# Patient Record
Sex: Female | Born: 1995 | Race: Black or African American | Hispanic: No | Marital: Single | Smoking: Never smoker
Health system: Southern US, Community
[De-identification: ages and names within clinical notes are randomized; demographics above are authoritative.]

## PROBLEM LIST (undated history)

## (undated) DIAGNOSIS — T7840XA Allergy, unspecified, initial encounter: Secondary | ICD-10-CM

## (undated) DIAGNOSIS — J45909 Unspecified asthma, uncomplicated: Secondary | ICD-10-CM

## (undated) DIAGNOSIS — I10 Essential (primary) hypertension: Secondary | ICD-10-CM

## (undated) HISTORY — DX: Allergy, unspecified, initial encounter: T78.40XA

---
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
## 2014-01-18 ENCOUNTER — Encounter (HOSPITAL_COMMUNITY): Payer: Self-pay | Admitting: Emergency Medicine

## 2014-01-18 ENCOUNTER — Emergency Department (HOSPITAL_COMMUNITY)
Admission: EM | Admit: 2014-01-18 | Discharge: 2014-01-18 | Disposition: A | Discharge status: Home / Self Care | Payer: BC Managed Care – PPO | Source: Home / Self Care | Attending: Family Medicine | Admitting: Family Medicine

## 2014-01-18 DIAGNOSIS — A084 Viral intestinal infection, unspecified: Secondary | ICD-10-CM

## 2014-01-18 DIAGNOSIS — A088 Other specified intestinal infections: Secondary | ICD-10-CM

## 2014-01-18 MED ORDER — ONDANSETRON HCL 8 MG PO TABS: 8.0000 mg | ORAL_TABLET | Freq: Three times a day (TID) | ORAL | Status: DC | PRN

## 2014-01-18 NOTE — ED Provider Notes (Signed)
Natalie Thompson is a 18 y.o. female who presents to Urgent Care today for nausea and diarrhea present for 3 days. Patient remained is also sick with a vomiting illness. No fevers or chills or abdominal pain. No blood in stool. No medications tried yet. No cough congestion or trouble breathing.   Past Medical History  Diagnosis Date  . Asthma    History  Substance Use Topics  . Smoking status: Never Smoker   . Smokeless tobacco: Not on file  . Alcohol Use: No   ROS as above Medications: No current facility-administered medications for this encounter.   Current Outpatient Prescriptions  Medication Sig Dispense Refill  . albuterol (PROVENTIL HFA;VENTOLIN HFA) 108 (90 BASE) MCG/ACT inhaler Inhale 1-2 puffs into the lungs every 6 (six) hours as needed for wheezing.  1 Inhaler  0  . amoxicillin (AMOXIL) 500 MG capsule Take 2 capsules (1,000 mg total) by mouth 3 (three) times daily.  60 capsule  0  . fexofenadine-pseudoephedrine (ALLEGRA-D) 60-120 MG per tablet Take 1 tablet by mouth every 12 (twelve) hours.  30 tablet  0  . guaiFENesin-codeine (GUIATUSS AC) 100-10 MG/5ML syrup Take 10 mLs by mouth 4 (four) times daily as needed for cough.  120 mL  0  . naproxen (NAPROSYN) 500 MG tablet Take 1 tablet (500 mg total) by mouth 2 (two) times daily.  30 tablet  0  . ondansetron (ZOFRAN) 8 MG tablet Take 1 tablet (8 mg total) by mouth every 8 (eight) hours as needed for nausea or vomiting.  20 tablet  0    Exam:  BP 117/73  Pulse 109  Temp(Src) 98.1 F (36.7 C) (Oral)  SpO2 100%  LMP 12/30/2013 Gen: Well NAD HEENT: EOMI,  MMM Lungs: Normal work of breathing. CTABL Heart: RRR no MRG Abd: NABS, Soft. NT, ND no rebound or guarding  Exts: Brisk capillary refill, warm and well perfused.   No results found for this or any previous visit (from the past 24 hour(s)). No results found.  Assessment and Plan: 18 y.o. female with viral gastroenteritis. Plan to treat with Zofran Imodium.  Watchful waiting. Followup as needed.  Discussed warning signs or symptoms. Please see discharge instructions. Patient expresses understanding.    Gregor Hams, MD 01/18/14 [phone]

## 2014-01-18 NOTE — ED Notes (Signed)
Patient complains of stomach pain Feels nausea and having dry heaves

## 2014-01-18 NOTE — Discharge Instructions (Signed)
Thank you for coming in today. Take Zofran every 8 hours as needed for vomiting. Use over-the-counter Imodium as needed for diarrhea Do not use Imodium if you're having abdominal pain. If your belly pain worsens, or you have high fever, bad vomiting, blood in your stool or black tarry stool go to the Emergency Room.  Viral Gastroenteritis Viral gastroenteritis is also known as stomach flu. This condition affects the stomach and intestinal tract. It can cause sudden diarrhea and vomiting. The illness typically lasts 3 to 8 days. Most people develop an immune response that eventually gets rid of the virus. While this natural response develops, the virus can make you quite ill. CAUSES  Many different viruses can cause gastroenteritis, such as rotavirus or noroviruses. You can catch one of these viruses by consuming contaminated food or water. You may also catch a virus by sharing utensils or other personal items with an infected person or by touching a contaminated surface. SYMPTOMS  The most common symptoms are diarrhea and vomiting. These problems can cause a severe loss of body fluids (dehydration) and a body salt (electrolyte) imbalance. Other symptoms may include:  Fever.  Headache.  Fatigue.  Abdominal pain. DIAGNOSIS  Your caregiver can usually diagnose viral gastroenteritis based on your symptoms and a physical exam. A stool sample may also be taken to test for the presence of viruses or other infections. TREATMENT  This illness typically goes away on its own. Treatments are aimed at rehydration. The most serious cases of viral gastroenteritis involve vomiting so severely that you are not able to keep fluids down. In these cases, fluids must be given through an intravenous line (IV). HOME CARE INSTRUCTIONS   Drink enough fluids to keep your urine clear or pale yellow. Drink small amounts of fluids frequently and increase the amounts as tolerated.  Ask your caregiver for specific  rehydration instructions.  Avoid:  Foods high in sugar.  Alcohol.  Carbonated drinks.  Tobacco.  Juice.  Caffeine drinks.  Extremely hot or cold fluids.  Fatty, greasy foods.  Too much intake of anything at one time.  Dairy products until 24 to 48 hours after diarrhea stops.  You may consume probiotics. Probiotics are active cultures of beneficial bacteria. They may lessen the amount and number of diarrheal stools in adults. Probiotics can be found in yogurt with active cultures and in supplements.  Wash your hands well to avoid spreading the virus.  Only take over-the-counter or prescription medicines for pain, discomfort, or fever as directed by your caregiver. Do not give aspirin to children. Antidiarrheal medicines are not recommended.  Ask your caregiver if you should continue to take your regular prescribed and over-the-counter medicines.  Keep all follow-up appointments as directed by your caregiver. SEEK IMMEDIATE MEDICAL CARE IF:   You are unable to keep fluids down.  You do not urinate at least once every 6 to 8 hours.  You develop shortness of breath.  You notice blood in your stool or vomit. This may look like coffee grounds.  You have abdominal pain that increases or is concentrated in one small area (localized).  You have persistent vomiting or diarrhea.  You have a fever.  The patient is a child younger than 3 months, and he or she has a fever.  The patient is a child older than 3 months, and he or she has a fever and persistent symptoms.  The patient is a child older than 3 months, and he or she has a fever  and symptoms suddenly get worse.  The patient is a baby, and he or she has no tears when crying. MAKE SURE YOU:   Understand these instructions.  Will watch your condition.  Will get help right away if you are not doing well or get worse. Document Released: 11/10/2005 Document Revised: 02/02/2012 Document Reviewed:  08/27/2011 [facility] Patient Information 2014 [place].

## 2014-01-19 LAB — POCT PREGNANCY, URINE: Preg Test, Ur: NEGATIVE

## 2015-09-25 ENCOUNTER — Ambulatory Visit (INDEPENDENT_AMBULATORY_CARE_PROVIDER_SITE_OTHER): Payer: BLUE CROSS/BLUE SHIELD | Admitting: Family Medicine

## 2015-09-25 VITALS — BP 124/80 | HR 75 | Temp 98.4°F | Resp 16 | Ht 64.0 in | Wt 167.4 lb

## 2015-09-25 DIAGNOSIS — J0100 Acute maxillary sinusitis, unspecified: Secondary | ICD-10-CM | POA: Diagnosis not present

## 2015-09-25 MED ORDER — AMOXICILLIN-POT CLAVULANATE 875-125 MG PO TABS
1.0000 | ORAL_TABLET | Freq: Two times a day (BID) | ORAL | Status: DC
Start: 2015-09-25 — End: 2016-02-10

## 2015-09-25 MED ORDER — HYDROCOD POLST-CPM POLST ER 10-8 MG/5ML PO SUER: 5.0000 mL | Freq: Two times a day (BID) | ORAL | Status: DC | PRN

## 2015-09-25 MED ORDER — PREDNISONE 20 MG PO TABS: ORAL_TABLET | ORAL | Status: DC

## 2015-09-25 NOTE — Progress Notes (Signed)
Subjective:  This chart was scribed for Delman Cheadle MD, by Tamsen Roers, at [facility].  This patient was seen in room 10 and the patient's care was started at 12:29 PM.    Patient ID: Natalie Thompson, female    DOB: [birth year], 19 y.o.   MRN: [id number] Chief Complaint  Patient presents with  . Cough  . Flu Vaccine    HPI  HPI Comments: Natalie Thompson is a 19 y.o. female who presents to the [facility] complaining of a productive cough onset two weeks ago (lasted 4-5 days) and states that it came back yesterday.  She lost her voice three days ago and states that she had a sore throat when this occurred.    She has associated symptoms of congestion and chest pain at night time due to the congestion. She was taking dayquil and nyquil as well as Theraflu but states that it did not alleviate her symptoms fully. She did not have an appetite this morning. She denies any urinary/ abnormal bowel symptoms.  Her best friend was sick recently.  She has no other complaints or concerns today.  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    Past Medical History  Diagnosis Date  . Asthma     Current Outpatient Prescriptions on File Prior to Visit  Medication Sig Dispense Refill  . albuterol (PROVENTIL HFA;VENTOLIN HFA) 108 (90 BASE) MCG/ACT inhaler Inhale 1-2 puffs into the lungs every 6 (six) hours as needed for wheezing. 1 Inhaler 0  . fexofenadine-pseudoephedrine (ALLEGRA-D) 60-120 MG per tablet Take 1 tablet by mouth every 12 (twelve) hours. (Patient not taking: Reported on 09/25/2015) 30 tablet 0  . guaiFENesin-codeine (GUIATUSS AC) 100-10 MG/5ML syrup Take 10 mLs by mouth 4 (four) times daily as needed for cough. (Patient not taking: Reported on 09/25/2015) 120 mL 0  . naproxen (NAPROSYN) 500 MG tablet Take 1 tablet (500 mg total) by mouth 2 (two) times daily. (Patient not taking: Reported on 09/25/2015)  30 tablet 0   No current facility-administered medications on file prior to visit.    No Known Allergies     Review of Systems  Constitutional: Negative for fever and chills.  HENT: Positive for congestion and sore throat.   Eyes: Negative for pain, redness and itching.  Respiratory: Positive for cough. Negative for choking and shortness of breath.   Cardiovascular: Negative for chest pain.  Gastrointestinal: Negative for nausea and vomiting.  Musculoskeletal: Negative for neck pain and neck stiffness.  Neurological: Negative for syncope and speech difficulty.       Objective:   Physical Exam  Constitutional: She is oriented to person, place, and time. She appears well-developed and well-nourished. No distress.  HENT:  Head: Normocephalic and atraumatic.  Mouth/Throat: Oropharynx is clear and moist.  Mid ear effusion on the right and left with some erythema.  Nares left greater than right with erythema and bogginess.   Eyes: Pupils are equal, round, and reactive to light.  Neck: Normal range of motion. No thyromegaly present.  Some submandibular nodes are enlarged. Tonsillar nodes are enlarged.  No anterior cervical lymphadenopathy Positive posterior cervical nodes, left greater than right.   Cardiovascular: Normal rate, regular rhythm, S1 normal, S2 normal and normal heart sounds.  Exam reveals no gallop and no friction rub.   No murmur heard. Pulmonary/Chest: Effort normal and breath sounds normal. No respiratory distress. She  has no wheezes. She has no rales. She exhibits no tenderness.  Musculoskeletal: Normal range of motion.  Lymphadenopathy:    She has no cervical adenopathy.  Neurological: She is alert and oriented to person, place, and time.  Skin: Skin is warm and dry.  Psychiatric: She has a normal mood and affect. Her behavior is normal.   Filed Vitals:   09/25/15 1204  BP: 124/80  Pulse: 75  Temp: 98.4 F (36.9 C)  TempSrc: Oral  Resp: 16  Height: 5'  4" (1.626 m)  Weight: 167 lb 6.4 oz (75.932 kg)  SpO2: 99%          Assessment & Plan:   1. Acute maxillary sinusitis, recurrence not specified      Meds ordered this encounter  Medications  . predniSONE (DELTASONE) 20 MG tablet    Sig: 3 tabs po qd x 3d, 2 tabs po qd x 3d, 1 tab po qd x 3d    Dispense:  18 tablet    Refill:  0  . amoxicillin-clavulanate (AUGMENTIN) 875-125 MG tablet    Sig: Take 1 tablet by mouth 2 (two) times daily.    Dispense:  20 tablet    Refill:  0  . chlorpheniramine-HYDROcodone (TUSSIONEX PENNKINETIC ER) 10-8 MG/5ML SUER    Sig: Take 5 mLs by mouth every 12 (twelve) hours as needed for cough.    Dispense:  90 mL    Refill:  0    I personally performed the services described in this documentation, which was scribed in my presence. The recorded information has been reviewed and considered, and addended by me as needed.  Delman Cheadle, MD MPH

## 2015-09-25 NOTE — Patient Instructions (Signed)

## 2016-02-10 ENCOUNTER — Ambulatory Visit (INDEPENDENT_AMBULATORY_CARE_PROVIDER_SITE_OTHER): Payer: BLUE CROSS/BLUE SHIELD | Admitting: Physician Assistant

## 2016-02-10 VITALS — BP 118/74 | HR 74 | Temp 98.5°F | Resp 14 | Ht 64.0 in | Wt 173.0 lb

## 2016-02-10 DIAGNOSIS — J302 Other seasonal allergic rhinitis: Secondary | ICD-10-CM | POA: Diagnosis not present

## 2016-02-10 DIAGNOSIS — J069 Acute upper respiratory infection, unspecified: Secondary | ICD-10-CM

## 2016-02-10 DIAGNOSIS — J4520 Mild intermittent asthma, uncomplicated: Secondary | ICD-10-CM

## 2016-02-10 DIAGNOSIS — B9789 Other viral agents as the cause of diseases classified elsewhere: Principal | ICD-10-CM

## 2016-02-10 MED ORDER — GUAIFENESIN ER 1200 MG PO TB12: 1.0000 | ORAL_TABLET | Freq: Two times a day (BID) | ORAL | Status: AC

## 2016-02-10 MED ORDER — ALBUTEROL SULFATE HFA 108 (90 BASE) MCG/ACT IN AERS: 1.0000 | INHALATION_SPRAY | Freq: Four times a day (QID) | RESPIRATORY_TRACT | Status: DC | PRN

## 2016-02-10 MED ORDER — FLUTICASONE PROPIONATE 50 MCG/ACT NA SUSP: 2.0000 | Freq: Every day | NASAL | Status: DC

## 2016-02-10 MED ORDER — HYDROCODONE-HOMATROPINE 5-1.5 MG/5ML PO SYRP: 5.0000 mL | ORAL_SOLUTION | Freq: Three times a day (TID) | ORAL | Status: DC | PRN

## 2016-02-10 NOTE — Patient Instructions (Addendum)
  Please push fluids.  Tylenol and Motrin for fever and body aches.    A humidifier can help especially when the air is dry -if you do not have a humidifier you can boil a pot of water on the stove in your home to help with the dry air.    IF you received an x-ray today, you will receive an invoice from [facility]. Please contact [facility] at [phone] with questions or concerns regarding your invoice.   IF you received labwork today, you will receive an invoice from [employer]. Please contact [facility] at [phone] with questions or concerns regarding your invoice.   Our billing staff will not be able to assist you with questions regarding bills from these companies.  You will be contacted with the lab results as soon as they are available. The fastest way to get your results is to activate your My Chart account. Instructions are located on the last page of this paperwork. If you have not heard from [place] regarding the results in 2 weeks, please contact this office.

## 2016-02-10 NOTE — Progress Notes (Signed)
   Natalie Thompson  MRN: [id number] DOB: [birth year]  Subjective:  Pt presents to clinic with 1 week h/o cold symptoms.  She was at spring break in [place] last week and she felt fine and when she got back to school it was cold and she started feeling bad.  She has seasonal allergies but this feel different and during her allergies is typically when she has problems with her asthma.  She has not felt like she needed her albuterol at all with this illness.  She has had chills but no fever.  She has sinus congestion with yellow rhinorrhea and a cough with yellow sputum at times.    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   No current outpatient prescriptions on file prior to visit.   No current facility-administered medications on file prior to visit.    No Known Allergies  Review of Systems  Constitutional: Positive for chills. Negative for fever.  HENT: Positive for congestion, rhinorrhea (yellow) and sore throat (worse in the am, slightly better throughout the day). Negative for postnasal drip.   Respiratory: Positive for cough (yellow/brown).        H/o seasonal allergies and asthma - no current asthma symptoms, smoker - marji - daily  Gastrointestinal: Positive for nausea. Negative for vomiting, abdominal pain and diarrhea.  Musculoskeletal: Positive for myalgias (mild).  Neurological: Positive for headaches.  Psychiatric/Behavioral: Positive for sleep disturbance (2nd to cough).   Objective:  BP 118/74 mmHg  Pulse 74  Temp(Src) 98.5 F (36.9 C) (Oral)  Resp 14  Ht 5\' 4"  (1.626 m)  Wt 173 lb (78.472 kg)  BMI 29.68 kg/m2  SpO2 98%  LMP 02/10/2016  Physical Exam  Constitutional: She is oriented to person, place, and time and well-developed, well-nourished, and in no distress.  HENT:  Head: Normocephalic and atraumatic.  Right Ear: Hearing, tympanic  membrane, external ear and ear canal normal.  Left Ear: Hearing, tympanic membrane, external ear and ear canal normal.  Nose: Mucosal edema (red) present.  Mouth/Throat: Uvula is midline, oropharynx is clear and moist and mucous membranes are normal.  Eyes: Conjunctivae are normal.  Neck: Normal range of motion.  Cardiovascular: Normal rate, regular rhythm and normal heart sounds.   No murmur heard. Pulmonary/Chest: Effort normal and breath sounds normal. She has no wheezes.  Neurological: She is alert and oriented to person, place, and time. Gait normal.  Skin: Skin is warm and dry.  Psychiatric: Mood, memory, affect and judgment normal.  Vitals reviewed.   Assessment and Plan :  Viral URI with cough - Plan: HYDROcodone-homatropine (HYCODAN) 5-1.5 MG/5ML syrup, Guaifenesin (MUCINEX MAXIMUM STRENGTH) 1200 MG TB12  Seasonal allergies - Plan: fluticasone (FLONASE) 50 MCG/ACT nasal spray  Asthma, mild intermittent, uncomplicated - Plan: albuterol (PROVENTIL HFA;VENTOLIN HFA) 108 (90 Base) MCG/ACT inhaler   Symptomatic treatment d/w pt along with the above plan.  She is ok to be at school.    Windell Hummingbird PA-C  [facility] 02/10/2016 9:36 AM

## 2016-02-11 ENCOUNTER — Encounter (HOSPITAL_COMMUNITY): Payer: Self-pay | Admitting: Emergency Medicine

## 2016-02-11 ENCOUNTER — Emergency Department (HOSPITAL_COMMUNITY)
Admission: EM | Admit: 2016-02-11 | Discharge: 2016-02-11 | Disposition: A | Discharge status: Home / Self Care | Payer: BLUE CROSS/BLUE SHIELD | Attending: Emergency Medicine | Admitting: Emergency Medicine

## 2016-02-11 DIAGNOSIS — Z793 Long term (current) use of hormonal contraceptives: Secondary | ICD-10-CM | POA: Insufficient documentation

## 2016-02-11 DIAGNOSIS — H109 Unspecified conjunctivitis: Secondary | ICD-10-CM | POA: Diagnosis not present

## 2016-02-11 DIAGNOSIS — J069 Acute upper respiratory infection, unspecified: Secondary | ICD-10-CM | POA: Diagnosis not present

## 2016-02-11 DIAGNOSIS — Z7951 Long term (current) use of inhaled steroids: Secondary | ICD-10-CM | POA: Insufficient documentation

## 2016-02-11 DIAGNOSIS — J45909 Unspecified asthma, uncomplicated: Secondary | ICD-10-CM | POA: Diagnosis not present

## 2016-02-11 DIAGNOSIS — Z792 Long term (current) use of antibiotics: Secondary | ICD-10-CM | POA: Diagnosis not present

## 2016-02-11 DIAGNOSIS — F17210 Nicotine dependence, cigarettes, uncomplicated: Secondary | ICD-10-CM | POA: Insufficient documentation

## 2016-02-11 DIAGNOSIS — Z79899 Other long term (current) drug therapy: Secondary | ICD-10-CM | POA: Insufficient documentation

## 2016-02-11 MED ORDER — POLYMYXIN B-TRIMETHOPRIM 10000-0.1 UNIT/ML-% OP SOLN: 1.0000 [drp] | OPHTHALMIC | Status: DC

## 2016-02-11 NOTE — ED Provider Notes (Signed)
CSN: [id number]     Arrival date & time 02/11/16  0621 History   First MD Initiated Contact with Patient 02/11/16 [phone]     Chief Complaint  Patient presents with  . Sore Throat  . Conjunctivitis  . Cough     (Consider location/radiation/quality/duration/timing/severity/associated sxs/prior Treatment) HPI   Pt presents with progressive URI symptoms x 1 week and left eye redness and discharge that began last night.  States the left eye is irritated and gritty, slightly itchy, feels swollen, vision blurry, and yellow/white discharge.  States the right eye is also starting to feel that way.  Was seen at urgent care for URI symptoms yesterday and feels that she is doing well regarding URI symptoms, came to ED today to be seen for her eye.  Denies any possibility of sexually transmitted infection of the eye.   Denies any possible trauma or FB in the eye, denies wearing contacts.    Past Medical History  Diagnosis Date  . Asthma    History reviewed. No pertinent past surgical history. History reviewed. No pertinent family history. Social History  Substance Use Topics  . Smoking status: Current Every Day Smoker -- 1.00 packs/day for 2 years    Types: Cigarettes  . Smokeless tobacco: None  . Alcohol Use: 0.0 oz/week    0 Standard drinks or equivalent per week   OB History    No data available     Review of Systems  Constitutional: Negative for fever and chills.  HENT: Positive for congestion. Negative for dental problem, ear pain, mouth sores, rhinorrhea, sore throat and trouble swallowing.   Eyes: Positive for pain, discharge, redness and itching. Negative for photophobia.  Respiratory: Positive for cough. Negative for shortness of breath, wheezing and stridor.   Cardiovascular: Negative for chest pain.  Musculoskeletal: Negative for neck pain and neck stiffness.  Allergic/Immunologic: Negative for immunocompromised state.      Allergies  Review of patient's allergies indicates  no known allergies.  Home Medications   Prior to Admission medications   Medication Sig Start Date End Date Taking? Authorizing Provider  albuterol (PROVENTIL HFA;VENTOLIN HFA) 108 (90 Base) MCG/ACT inhaler Inhale 1-2 puffs into the lungs every 6 (six) hours as needed for wheezing. 02/10/16   Mancel Bale, PA-C  fluticasone (FLONASE) 50 MCG/ACT nasal spray Place 2 sprays into both nostrils daily. 02/10/16   Mancel Bale, PA-C  Guaifenesin [facility] MAXIMUM STRENGTH) 1200 MG TB12 Take 1 tablet (1,200 mg total) by mouth 2 (two) times daily. 02/10/16 02/17/16  Mancel Bale, PA-C  HYDROcodone-homatropine (HYCODAN) 5-1.5 MG/5ML syrup Take 5 mLs by mouth every 8 (eight) hours as needed for cough. 02/10/16   Mancel Bale, PA-C  norethindrone-ethinyl estradiol (MICROGESTIN,JUNEL,LOESTRIN) 1-20 MG-MCG tablet  01/24/16   Historical Provider, MD  trimethoprim-polymyxin b (POLYTRIM) ophthalmic solution Place 1 drop into the left eye every 4 (four) hours. X 7 days 02/11/16   Clayton Bibles, PA-C   BP 135/91 mmHg  Pulse 73  Temp(Src) 98.2 F (36.8 C) (Oral)  Resp 16  Ht 5\' 4"  (1.626 m)  Wt 78.472 kg  BMI 29.68 kg/m2  SpO2 99%  LMP 02/10/2016 (Exact Date) Physical Exam  Constitutional: She appears well-developed and well-nourished. No distress.  HENT:  Head: Normocephalic and atraumatic.  Mouth/Throat: Oropharynx is clear and moist. No oropharyngeal exudate.  Eyes: EOM are normal. Pupils are equal, round, and reactive to light. Right eye exhibits no discharge. Left eye exhibits discharge and exudate. Left eye exhibits  no chemosis and no hordeolum. No foreign body present in the left eye. Left conjunctiva is injected. Left conjunctiva has no hemorrhage. No scleral icterus.  Left eye with lid edema, thick yellow discharge, conjunctiva injected.  Right eye with slight watering, no injection.    Neck: Neck supple.  Cardiovascular: Normal rate and regular rhythm.   Pulmonary/Chest: Effort normal and breath sounds  normal. No respiratory distress. She has no wheezes. She has no rales.  Neurological: She is alert.  Skin: She is not diaphoretic.  Nursing note and vitals reviewed.   ED Course  Procedures (including critical care time) Labs Review Labs Reviewed - No data to display  Imaging Review No results found. I have personally reviewed and evaluated these images and lab results as part of my medical decision-making.   EKG Interpretation None      MDM   Final diagnoses:  Conjunctivitis of left eye  URI (upper respiratory infection)   Afebrile, nontoxic patient with constellation of symptoms suggestive of viral syndrome.  Left eye with conjucntivitis, possibly viral but given the isolated findings of the left eye will cover for bacterial.  Pt denies possibility of FB, no trauma, does not wear contacts. Doubt iritis, FB, corneal abrasion. Discharged home with supportive care, PCP follow up.  Discussed result, findings, treatment, and follow up  with patient.  Pt given return precautions.  Pt verbalizes understanding and agrees with plan.        [place], PA-C 02/11/16 [place], DO 02/11/16 1515

## 2016-02-11 NOTE — ED Notes (Signed)
Declined W/C at D/C and was escorted to lobby by RN. 

## 2016-02-11 NOTE — Discharge Instructions (Signed)
Read the information below.  Use the prescribed medication as directed.  Please discuss all new medications with your pharmacist.  You may return to the Emergency Department at any time for worsening condition or any new symptoms that concern you.    If you develop worsening pain in your eye, change in your vision, swelling around your eye, difficulty moving your eye, or fevers greater than 100.4, see your eye doctor or return to the Emergency Department immediately for a recheck.      Bacterial Conjunctivitis Bacterial conjunctivitis, commonly called pink eye, is an inflammation of the clear membrane that covers the white part of the eye (conjunctiva). The inflammation can also happen on the underside of the eyelids. The blood vessels in the conjunctiva become inflamed, causing the eye to become red or pink. Bacterial conjunctivitis may spread easily from one eye to another and from person to person (contagious).  CAUSES  Bacterial conjunctivitis is caused by bacteria. The bacteria may come from your own skin, your upper respiratory tract, or from someone else with bacterial conjunctivitis. SYMPTOMS  The normally white color of the eye or the underside of the eyelid is usually pink or red. The pink eye is usually associated with irritation, tearing, and some sensitivity to light. Bacterial conjunctivitis is often associated with a thick, yellowish discharge from the eye. The discharge may turn into a crust on the eyelids overnight, which causes your eyelids to stick together. If a discharge is present, there may also be some blurred vision in the affected eye. DIAGNOSIS  Bacterial conjunctivitis is diagnosed by your caregiver through an eye exam and the symptoms that you report. Your caregiver looks for changes in the surface tissues of your eyes, which may point to the specific type of conjunctivitis. A sample of any discharge may be collected on a cotton-tip swab if you have a severe case of  conjunctivitis, if your cornea is affected, or if you keep getting repeat infections that do not respond to treatment. The sample will be sent to a lab to see if the inflammation is caused by a bacterial infection and to see if the infection will respond to antibiotic medicines. TREATMENT   Bacterial conjunctivitis is treated with antibiotics. Antibiotic eyedrops are most often used. However, antibiotic ointments are also available. Antibiotics pills are sometimes used. Artificial tears or eye washes may ease discomfort. HOME CARE INSTRUCTIONS   To ease discomfort, apply a cool, clean washcloth to your eye for 10-20 minutes, 3-4 times a day.  Gently wipe away any drainage from your eye with a warm, wet washcloth or a cotton ball.  Wash your hands often with soap and water. Use paper towels to dry your hands.  Do not share towels or washcloths. This may spread the infection.  Change or wash your pillowcase every day.  You should not use eye makeup until the infection is gone.  Do not operate machinery or drive if your vision is blurred.  Stop using contact lenses. Ask your caregiver how to sterilize or replace your contacts before using them again. This depends on the type of contact lenses that you use.  When applying medicine to the infected eye, do not touch the edge of your eyelid with the eyedrop bottle or ointment tube. SEEK IMMEDIATE MEDICAL CARE IF:   Your infection has not improved within 3 days after beginning treatment.  You had yellow discharge from your eye and it returns.  You have increased eye pain.  Your eye redness  is spreading.  Your vision becomes blurred.  You have a fever or persistent symptoms for more than 2-3 days.  You have a fever and your symptoms suddenly get worse.  You have facial pain, redness, or swelling. MAKE SURE YOU:   Understand these instructions.  Will watch your condition.  Will get help right away if you are not doing well or get  worse.   This information is not intended to replace advice given to you by your health care provider. Make sure you discuss any questions you have with your health care provider.   Document Released: 11/10/2005 Document Revised: 12/01/2014 Document Reviewed: 04/12/2012 Elsevier Interactive Patient Education 2016 Elsevier Inc.  Viral Infections A viral infection can be caused by different types of viruses.Most viral infections are not serious and resolve on their own. However, some infections may cause severe symptoms and may lead to further complications. SYMPTOMS Viruses can frequently cause:  Minor sore throat.  Aches and pains.  Headaches.  Runny nose.  Different types of rashes.  Watery eyes.  Tiredness.  Cough.  Loss of appetite.  Gastrointestinal infections, resulting in nausea, vomiting, and diarrhea. These symptoms do not respond to antibiotics because the infection is not caused by bacteria. However, you might catch a bacterial infection following the viral infection. This is sometimes called a "superinfection." Symptoms of such a bacterial infection may include:  Worsening sore throat with pus and difficulty swallowing.  Swollen neck glands.  Chills and a high or persistent fever.  Severe headache.  Tenderness over the sinuses.  Persistent overall ill feeling (malaise), muscle aches, and tiredness (fatigue).  Persistent cough.  Yellow, green, or brown mucus production with coughing. HOME CARE INSTRUCTIONS   Only take over-the-counter or prescription medicines for pain, discomfort, diarrhea, or fever as directed by your caregiver.  Drink enough water and fluids to keep your urine clear or pale yellow. Sports drinks can provide valuable electrolytes, sugars, and hydration.  Get plenty of rest and maintain proper nutrition. Soups and broths with crackers or rice are fine. SEEK IMMEDIATE MEDICAL CARE IF:   You have severe headaches, shortness of  breath, chest pain, neck pain, or an unusual rash.  You have uncontrolled vomiting, diarrhea, or you are unable to keep down fluids.  You or your child has an oral temperature above 102 F (38.9 C), not controlled by medicine.  Your baby is older than 3 months with a rectal temperature of 102 F (38.9 C) or higher.  Your baby is 59 months old or younger with a rectal temperature of 100.4 F (38 C) or higher. MAKE SURE YOU:   Understand these instructions.  Will watch your condition.  Will get help right away if you are not doing well or get worse.   This information is not intended to replace advice given to you by your health care provider. Make sure you discuss any questions you have with your health care provider.   Document Released: 08/20/2005 Document Revised: 02/02/2012 Document Reviewed: 04/18/2015 Elsevier Interactive Patient Education [employer].

## 2016-02-11 NOTE — ED Notes (Signed)
Patient here with complaint of left eye drainage, cough, and sore throat. States onset 2 days ago. Endorses some blurred vision in left eye. Denies fever.

## 2016-05-20 ENCOUNTER — Ambulatory Visit (INDEPENDENT_AMBULATORY_CARE_PROVIDER_SITE_OTHER): Payer: BLUE CROSS/BLUE SHIELD | Admitting: Physician Assistant

## 2016-05-20 VITALS — BP 122/82 | HR 80 | Temp 98.3°F | Resp 18 | Ht 64.0 in | Wt 171.6 lb

## 2016-05-20 DIAGNOSIS — R062 Wheezing: Secondary | ICD-10-CM

## 2016-05-20 DIAGNOSIS — J069 Acute upper respiratory infection, unspecified: Secondary | ICD-10-CM

## 2016-05-20 DIAGNOSIS — B9789 Other viral agents as the cause of diseases classified elsewhere: Principal | ICD-10-CM

## 2016-05-20 MED ORDER — HYDROCODONE-HOMATROPINE 5-1.5 MG/5ML PO SYRP: 5.0000 mL | ORAL_SOLUTION | Freq: Four times a day (QID) | ORAL | Status: DC | PRN

## 2016-05-20 MED ORDER — IBUPROFEN 600 MG PO TABS: 600.0000 mg | ORAL_TABLET | Freq: Four times a day (QID) | ORAL | Status: DC | PRN

## 2016-05-20 MED ORDER — CETIRIZINE-PSEUDOEPHEDRINE ER 5-120 MG PO TB12: 1.0000 | ORAL_TABLET | Freq: Two times a day (BID) | ORAL | Status: DC

## 2016-05-20 MED ORDER — IPRATROPIUM BROMIDE 0.02 % IN SOLN
0.5000 mg | Freq: Once | RESPIRATORY_TRACT | Status: AC
  Administered 2016-05-20: 0.5 mg via RESPIRATORY_TRACT

## 2016-05-20 MED ORDER — ALBUTEROL SULFATE (2.5 MG/3ML) 0.083% IN NEBU
2.5000 mg | INHALATION_SOLUTION | Freq: Once | RESPIRATORY_TRACT | Status: AC
Start: 2016-05-20 — End: 2016-05-20
  Administered 2016-05-20: 2.5 mg via RESPIRATORY_TRACT

## 2016-05-20 NOTE — Patient Instructions (Signed)
     IF you received an x-ray today, you will receive an invoice from [facility]. Please contact [facility] at [phone] with questions or concerns regarding your invoice.   IF you received labwork today, you will receive an invoice from [facility]. Please contact [facility] at [phone] with questions or concerns regarding your invoice.   Our billing staff will not be able to assist you with questions regarding bills from these companies.  You will be contacted with the lab results as soon as they are available. The fastest way to get your results is to activate your My Chart account. Instructions are located on the last page of this paperwork. If you have not heard from us regarding the results in 2 weeks, please contact this office.      

## 2016-05-20 NOTE — Progress Notes (Signed)
05/24/2016 8:29 AM   DOB: Feb 01, 1996 / MRN: [id number]  SUBJECTIVE:  Natalie Thompson is a 20 y.o. female presenting for cough, sore throat and nasal congestion that started within the last 36 hours. She has a history of asthma.  She has tried tylenol and Ibuprofen without relief.  She denies rash, nausea, and belly pain.   She has No Known Allergies.   She  has a past medical history of Asthma.    She  reports that she has never smoked. She does not have any smokeless tobacco history on file. She reports that she drinks alcohol. She reports that she does not use illicit drugs. She  reports that she currently engages in sexual activity. She reports using the following methods of birth control/protection: Condom and Pill. The patient  has no past surgical history on file.  Her family history is not on file.  Review of Systems  Constitutional: Negative for fever and chills.  Eyes: Negative for blurred vision.  Respiratory: Negative for cough and shortness of breath.   Cardiovascular: Negative for chest pain.  Gastrointestinal: Negative for nausea and abdominal pain.  Genitourinary: Negative for dysuria, urgency and frequency.  Musculoskeletal: Negative for myalgias.  Skin: Negative for rash.  Neurological: Negative for dizziness, tingling and headaches.  Psychiatric/Behavioral: Negative for depression. The patient is not nervous/anxious.     Problem list and medications reviewed and updated by myself where necessary, and exist elsewhere in the encounter.   OBJECTIVE:  BP 122/82 mmHg  Pulse 80  Temp(Src) 98.3 F (36.8 C) (Oral)  Resp 18  Ht 5\' 4"  (1.626 m)  Wt 171 lb 9.6 oz (77.837 kg)  BMI 29.44 kg/m2  SpO2 100%  Physical Exam  Constitutional: She is oriented to person, place, and time. She appears well-nourished. No distress.  Eyes: EOM are normal. Pupils are equal, round, and reactive to light.  Cardiovascular: Normal rate.   Pulmonary/Chest: Effort normal. She has  wheezes (bilateral, inspiratory. ).  Abdominal: She exhibits no distension.  Neurological: She is alert and oriented to person, place, and time. No cranial nerve deficit. Gait normal.  Skin: Skin is dry. She is not diaphoretic.  Psychiatric: She has a normal mood and affect.  Vitals reviewed.   No results found for this or any previous visit (from the past 72 hour(s)).  No results found.  ASSESSMENT AND PLAN  Natalie Thompson was seen today for cough, sore throat, otalgia and headache.  Diagnoses and all orders for this visit:  Viral URI with cough -     HYDROcodone-homatropine (HYCODAN) 5-1.5 MG/5ML syrup; Take 5 mLs by mouth every 6 (six) hours as needed for cough. -     cetirizine-pseudoephedrine (ZYRTEC-D ALLERGY & CONGESTION) 5-120 MG tablet; Take 1 tablet by mouth 2 (two) times daily. -     ibuprofen (ADVIL,MOTRIN) 600 MG tablet; Take 1 tablet (600 mg total) by mouth every 6 (six) hours as needed.  Wheezing -     albuterol (PROVENTIL) (2.5 MG/3ML) 0.083% nebulizer solution 2.5 mg; Take 3 mLs (2.5 mg total) by nebulization once. -     ipratropium (ATROVENT) nebulizer solution 0.5 mg; Take 2.5 mLs (0.5 mg total) by nebulization once.    The patient was advised to call or return to clinic if she does not see an improvement in symptoms or to seek the care of the closest emergency department if she worsens with the above plan.   Philis Fendt, MHS, PA-C [facility] 05/24/2016 8:29 AM

## 2016-07-07 ENCOUNTER — Other Ambulatory Visit: Payer: Self-pay | Admitting: Physician Assistant

## 2016-07-07 DIAGNOSIS — J4520 Mild intermittent asthma, uncomplicated: Secondary | ICD-10-CM

## 2017-09-05 ENCOUNTER — Encounter (HOSPITAL_COMMUNITY): Payer: Self-pay | Admitting: Emergency Medicine

## 2017-09-05 ENCOUNTER — Emergency Department (HOSPITAL_COMMUNITY)
Admission: EM | Admit: 2017-09-05 | Discharge: 2017-09-05 | Disposition: A | Discharge status: Home / Self Care | Payer: BLUE CROSS/BLUE SHIELD | Attending: Emergency Medicine | Admitting: Emergency Medicine

## 2017-09-05 DIAGNOSIS — Z5321 Procedure and treatment not carried out due to patient leaving prior to being seen by health care provider: Secondary | ICD-10-CM | POA: Diagnosis not present

## 2017-09-05 DIAGNOSIS — R1013 Epigastric pain: Secondary | ICD-10-CM | POA: Diagnosis not present

## 2017-09-05 MED ORDER — ONDANSETRON 4 MG PO TBDP: 4.0000 mg | ORAL_TABLET | Freq: Once | ORAL | Status: DC | PRN

## 2017-09-05 NOTE — ED Triage Notes (Signed)
Pt is c/o epigastric pain that started about 30 minutes ago  Pt states she has had nausea and vomiting with the pain   States her stomach feels like it is in a knot

## 2017-09-05 NOTE — ED Notes (Signed)
Pt reports to phlebotomist that she is feeling better and is going to go

## 2018-06-21 ENCOUNTER — Other Ambulatory Visit: Payer: Self-pay | Admitting: Family

## 2018-12-23 ENCOUNTER — Ambulatory Visit: Payer: BLUE CROSS/BLUE SHIELD | Admitting: Nurse Practitioner

## 2018-12-27 ENCOUNTER — Ambulatory Visit: Payer: 59 | Admitting: Nurse Practitioner

## 2018-12-27 ENCOUNTER — Encounter: Payer: Self-pay | Admitting: Nurse Practitioner

## 2018-12-27 VITALS — BP 138/98 | HR 69 | Temp 98.7°F | Ht 64.4 in | Wt 188.2 lb

## 2018-12-27 DIAGNOSIS — I10 Essential (primary) hypertension: Secondary | ICD-10-CM | POA: Diagnosis not present

## 2018-12-27 LAB — POCT UA - MICROALBUMIN
Albumin/Creatinine Ratio, Urine, POC: 30
Creatinine, POC: 300 mg/dL
Microalbumin Ur, POC: 80 mg/L

## 2018-12-27 LAB — POCT URINALYSIS DIPSTICK
Bilirubin, UA: NEGATIVE
Blood, UA: NEGATIVE
Glucose, UA: NEGATIVE
Ketones, UA: NEGATIVE
Leukocytes, UA: NEGATIVE
Nitrite, UA: NEGATIVE
Protein, UA: NEGATIVE
Spec Grav, UA: 1.025 (ref 1.010–1.025)
Urobilinogen, UA: 1 E.U./dL
pH, UA: 6.5 (ref 5.0–8.0)

## 2018-12-27 MED ORDER — AMLODIPINE BESYLATE 5 MG PO TABS: 5.0000 mg | ORAL_TABLET | Freq: Every day | ORAL | 2 refills | Status: DC

## 2018-12-27 NOTE — Patient Instructions (Addendum)

## 2018-12-27 NOTE — Progress Notes (Signed)
Subjective:     Patient ID: Natalie Thompson , female    DOB: [birth year] , 23 y.o.   MRN: [id number]   Chief Complaint  Patient presents with  . Establish Care    patient states she needs a new pcp  . Hypertension    patient was referred by ob due to her bp being high.  . Eczema    patient states she would like a referral for her feet. they are peeling.    HPI  Here to establish care - she was referred by Dr. Everett Graff - she had been going to [facility] her provider left and has been back since May.  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  Single.  No children.  She does feel maybe as a child she had elevated blood pressures.  She will take her birth control pills the whole pack through so no menstrual cycle.    PMH - HTN (she has been taking Labetolol due to HCTZ was causing her to feel bad).  Dx in December.    [facility] - Mother - healthy, Father - healthy.    Smokes marijuana.  Social drinker.    She has not used her albuterol inhaler in a while, uses mostly with cold weather change.   In 2014 she weighed approximately 210 lbs.  She lost a significant amount of weight in recent years but feels is gaining back.    Hypertension  This is a new problem. The current episode started more than 1 month ago. The problem is uncontrolled. Pertinent negatives include no chest pain, headaches, palpitations or shortness of breath. There are no associated agents to hypertension. Risk factors for coronary artery disease include obesity. Past treatments include beta blockers. The current treatment provides no improvement. There are no compliance problems (denies drinking caffeine or energy drinks.  ).  There is no history of angina. There is no history of chronic renal disease.     Past Medical History:  Diagnosis Date  . Allergy   . Asthma      Family History  Problem Relation Age of Onset  . Cancer Other   . Diabetes Other       Current Outpatient Medications:  .  LABETALOL HCL PO, Take 1 tablet by mouth 2 (two) times daily., Disp: , Rfl:  .  norethindrone-ethinyl estradiol (MICROGESTIN,JUNEL,LOESTRIN) 1-20 MG-MCG tablet, , Disp: , Rfl: 0 .  PROAIR HFA 108 (90 Base) MCG/ACT inhaler, INHALE 1 TO 2 PUFFS EVERY 6 HOURS IF NEEDED FOR WHEEZING, Disp: 8.5 g, Rfl: 0   No Known Allergies   Review of Systems  Constitutional: Negative.  Negative for fatigue.  Eyes: Negative for visual disturbance.  Respiratory: Negative.  Negative for shortness of breath.   Cardiovascular: Negative.  Negative for chest pain, palpitations and leg swelling.  Gastrointestinal: Negative.   Endocrine: Negative.   Musculoskeletal: Negative.   Skin: Negative.   Neurological: Negative for dizziness, weakness and headaches.  Psychiatric/Behavioral: Negative for confusion. The patient is not nervous/anxious.      Today's Vitals   12/27/18 1057 12/27/18 1103  BP: (!) 142/102 (!) 138/98  Pulse: 69   Temp: 98.7 F (37.1 C)   TempSrc: Oral   SpO2: 98%   Weight: 188 lb 3.2 oz (85.4 kg)   Height: 5' 4.4" (1.636 m)   PainSc: 0-No pain    Body mass index is 31.9 kg/m.   Objective:  Physical Exam Vitals  signs reviewed.  Constitutional:      Appearance: She is well-developed.  HENT:     Head: Normocephalic and atraumatic.  Eyes:     Pupils: Pupils are equal, round, and reactive to light.  Cardiovascular:     Rate and Rhythm: Normal rate and regular rhythm.     Pulses: Normal pulses.     Heart sounds: Normal heart sounds. No murmur.  Pulmonary:     Effort: Pulmonary effort is normal.     Breath sounds: Normal breath sounds.  Musculoskeletal: Normal range of motion.  Skin:    General: Skin is warm and dry.     Capillary Refill: Capillary refill takes less than 2 seconds.  Neurological:     General: No focal deficit present.     Mental Status: She is alert and oriented to person, place, and time.     Cranial Nerves: No  cranial nerve deficit.  Psychiatric:        Mood and Affect: Mood normal.         Assessment And Plan:     1. Hypertension, unspecified type  Referred by Dr. Mancel Bale (GYN) for elevated blood pressure, continues to elevated this visit.  I will stop the labetolol and start her on amlodipine  Encouraged to limit her salt intake and to drink adequate amounts of water.    She will return in 2 weeks for follow up - CBC no Diff - CMP14 + Anion Gap - Lipid Profile - Hemoglobin A1c - amLODipine (NORVASC) 5 MG tablet; Take 1 tablet (5 mg total) by mouth daily.  Dispense: 30 tablet; Refill: [place], FNP

## 2018-12-28 LAB — CBC
Hematocrit: 40.4 % (ref 34.0–46.6)
Hemoglobin: 13.7 g/dL (ref 11.1–15.9)
MCH: 31.9 pg (ref 26.6–33.0)
MCHC: 33.9 g/dL (ref 31.5–35.7)
MCV: 94 fL (ref 79–97)
Platelets: 330 10*3/uL (ref 150–450)
RBC: 4.3 x10E6/uL (ref 3.77–5.28)
RDW: 12.4 % (ref 11.7–15.4)
WBC: 4.7 10*3/uL (ref 3.4–10.8)

## 2018-12-28 LAB — LIPID PANEL
Chol/HDL Ratio: 2.2 ratio (ref 0.0–4.4)
Cholesterol, Total: 137 mg/dL (ref 100–199)
HDL: 63 mg/dL (ref >39)
LDL Calculated: 62 mg/dL (ref 0–99)
Triglycerides: 61 mg/dL (ref 0–149)
VLDL Cholesterol Cal: 12 mg/dL (ref 5–40)

## 2018-12-28 LAB — CMP14 + ANION GAP
ALT: 13 IU/L (ref 0–32)
AST: 14 IU/L (ref 0–40)
Albumin/Globulin Ratio: 1.4 (ref 1.2–2.2)
Albumin: 3.9 g/dL (ref 3.9–5.0)
Alkaline Phosphatase: 50 IU/L (ref 39–117)
Anion Gap: 14 mmol/L (ref 10.0–18.0)
BUN/Creatinine Ratio: 13 (ref 9–23)
BUN: 9 mg/dL (ref 6–20)
Bilirubin Total: 0.6 mg/dL (ref 0.0–1.2)
CO2: 21 mmol/L (ref 20–29)
Calcium: 9 mg/dL (ref 8.7–10.2)
Chloride: 104 mmol/L (ref 96–106)
Creatinine, Ser: 0.72 mg/dL (ref 0.57–1.00)
GFR calc Af Amer: 136 mL/min/{1.73_m2} (ref >59)
GFR calc non Af Amer: 118 mL/min/{1.73_m2} (ref >59)
Globulin, Total: 2.7 g/dL (ref 1.5–4.5)
Glucose: 81 mg/dL (ref 65–99)
Potassium: 4.5 mmol/L (ref 3.5–5.2)
Sodium: 139 mmol/L (ref 134–144)
Total Protein: 6.6 g/dL (ref 6.0–8.5)

## 2018-12-28 LAB — HEMOGLOBIN A1C
Est. average glucose Bld gHb Est-mCnc: 105 mg/dL
Hgb A1c MFr Bld: 5.3 % (ref 4.8–5.6)

## 2019-01-10 ENCOUNTER — Ambulatory Visit: Payer: 59 | Admitting: Nurse Practitioner

## 2019-01-10 ENCOUNTER — Encounter: Payer: Self-pay | Admitting: Nurse Practitioner

## 2019-01-10 VITALS — BP 130/88 | HR 81 | Temp 98.8°F | Ht 63.2 in | Wt 187.0 lb

## 2019-01-10 DIAGNOSIS — E6609 Other obesity due to excess calories: Secondary | ICD-10-CM | POA: Diagnosis not present

## 2019-01-10 DIAGNOSIS — Z6832 Body mass index (BMI) 32.0-32.9, adult: Secondary | ICD-10-CM

## 2019-01-10 DIAGNOSIS — J4520 Mild intermittent asthma, uncomplicated: Secondary | ICD-10-CM

## 2019-01-10 DIAGNOSIS — Z720 Tobacco use: Secondary | ICD-10-CM | POA: Insufficient documentation

## 2019-01-10 DIAGNOSIS — I10 Essential (primary) hypertension: Secondary | ICD-10-CM

## 2019-01-10 DIAGNOSIS — E66811 Obesity, class 1: Secondary | ICD-10-CM

## 2019-01-10 MED ORDER — ALBUTEROL SULFATE HFA 108 (90 BASE) MCG/ACT IN AERS: 2.0000 | INHALATION_SPRAY | Freq: Four times a day (QID) | RESPIRATORY_TRACT | 6 refills | Status: AC | PRN

## 2019-01-10 NOTE — Progress Notes (Signed)
Subjective:     Patient ID: Natalie Thompson , female    DOB: [birth year] , 23 y.o.   MRN: [id number]   Chief Complaint  Patient presents with  . Hypertension    HPI  Hypertension  This is a chronic problem. The current episode started more than 1 month ago. Pertinent negatives include no chest pain, headaches, palpitations or shortness of breath. 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  ). There are no compliance problems.  There is no history of angina.  Asthma  There is no chest tightness or shortness of breath. This is a chronic problem. The current episode started more than 1 year ago. The problem occurs intermittently. The problem has been unchanged. Pertinent negatives include no appetite change, chest pain, headaches or sore throat. Her past medical history is significant for asthma.     Past Medical History:  Diagnosis Date  . Allergy   . Asthma      Family History  Problem Relation Age of Onset  . Cancer Other   . Diabetes Other      Current Outpatient Medications:  .  amLODipine (NORVASC) 5 MG tablet, Take 1 tablet (5 mg total) by mouth daily., Disp: 30 tablet, Rfl: 2 .  LABETALOL HCL PO, Take 1 tablet by mouth 2 (two) times daily., Disp: , Rfl:  .  norethindrone-ethinyl estradiol (MICROGESTIN,JUNEL,LOESTRIN) 1-20 MG-MCG tablet, , Disp: , Rfl: 0 .  PROAIR HFA 108 (90 Base) MCG/ACT inhaler, INHALE 1 TO 2 PUFFS EVERY 6 HOURS IF NEEDED FOR WHEEZING, Disp: 8.5 g, Rfl: 0   No Known Allergies   Review of Systems  Constitutional: Negative.  Negative for appetite change and fatigue.  HENT: Negative for sore throat.   Eyes: Negative for visual disturbance.  Respiratory: Negative.  Negative for shortness of breath.   Cardiovascular: Negative.  Negative for chest pain, palpitations and leg swelling.  Gastrointestinal: Negative.   Endocrine: Negative.    Musculoskeletal: Negative.   Skin: Negative.   Neurological: Negative for dizziness, weakness and headaches.  Psychiatric/Behavioral: Negative for confusion. The patient is not nervous/anxious.       Today's Vitals   01/10/19 1537  BP: 130/88  Pulse: 81  Temp: 98.8 F (37.1 C)  TempSrc: Oral  SpO2: 98%  Weight: 187 lb (84.8 kg)  Height: 5' 3.2" (1.605 m)  PainSc: 0-No pain   Body mass index is 32.92 kg/m.   Objective:  Physical Exam Vitals signs reviewed.  Constitutional:      Appearance: She is well-developed.  HENT:     Head: Normocephalic and atraumatic.  Eyes:     Pupils: Pupils are equal, round, and reactive to light.  Cardiovascular:     Rate and Rhythm: Normal rate and regular rhythm.     Pulses: Normal pulses.     Heart sounds: Normal heart sounds. No murmur.  Pulmonary:     Effort: Pulmonary effort is normal.     Breath sounds: Normal breath sounds.  Musculoskeletal: Normal range of motion.  Skin:    General: Skin is warm and dry.     Capillary Refill: Capillary refill takes less than 2 seconds.  Neurological:     General: No focal deficit present.     Mental Status: She is alert and oriented to person, place, and time.     Cranial Nerves: No cranial nerve deficit.  Psychiatric:  Mood and Affect: Mood normal.        Behavior: Behavior normal.        Thought Content: Thought content normal.        Judgment: Judgment normal.         Assessment And Plan:     1. Hypertension, unspecified type  Blood pressure is improving  Encouraged to continue to limit intake of high salt foods and to drink at least 1/2 body weight in ounces of water daily  Continue to increase her physical activity  2. Mild intermittent asthma without complication  Reports wheezing with exercise and needs refill of proair  No current wheezing  3. Class 1 obesity due to excess calories with serious comorbidity and body mass index (BMI) of 32.0 to 32.9 in  adult  Chronic  Discussed healthy diet and regular exercise options   Encouraged to exercise at least 150 minutes per week with 2 days of strength training      Minette Brine, FNP

## 2019-01-10 NOTE — Patient Instructions (Addendum)
Tobacco Use Disorder Tobacco use disorder (TUD) occurs when a person craves, seeks, and uses tobacco, regardless of the consequences. This disorder can cause problems with mental and physical health. It can affect your ability to have healthy relationships, and it can keep you from meeting your responsibilities at work, home, or school. Tobacco may be:  Smoked as a cigarette or cigar.  Inhaled using e-cigarettes.  Smoked in a pipe or hookah.  Chewed as smokeless tobacco.  Inhaled into the nostrils as snuff. Tobacco products contain a dangerous chemical called nicotine, which is very addictive. Nicotine triggers hormones that make the body feel stimulated and works on areas of the brain that make you feel good. These effects can make it hard for people to quit nicotine. Tobacco contains many other unsafe chemicals that can damage almost every organ in the body. Smoking tobacco also puts others in danger due to fire risk and possible health problems caused by breathing in secondhand smoke. What are the signs or symptoms? Symptoms of TUD may include:  Being unable to slow down or stop your tobacco use.  Spending an abnormal amount of time getting or using tobacco.  Craving tobacco. Cravings may last for up to 6 months after quitting.  Tobacco use that: ? Interferes with your work, school, or home life. ? Interferes with your personal and social relationships. ? Makes you give up activities that you once enjoyed or found important.  Using tobacco even though you know that it is: ? Dangerous or bad for your health or someone else's health. ? Causing problems in your life.  Needing more and more of the substance to get the same effect (developing tolerance).  Experiencing unpleasant symptoms if you do not use the substance (withdrawal). Withdrawal symptoms may include: ? Depressed, anxious, or irritable mood. ? Difficulty concentrating. ? Increased appetite. ? Restlessness or trouble  sleeping.  Using the substance to avoid withdrawal. How is this diagnosed? This condition may be diagnosed based on:  Your current and past tobacco use. Your health care provider may ask questions about how your tobacco use affects your life.  A physical exam. You may be diagnosed with TUD if you have at least two symptoms within a 12-month period. How is this treated? This condition is treated by stopping tobacco use. Many people are unable to quit on their own and need help. Treatment may include:  Nicotine replacement therapy (NRT). NRT provides nicotine without the other harmful chemicals in tobacco. NRT gradually lowers the dosage of nicotine in the body and reduces withdrawal symptoms. NRT is available as: ? Over-the-counter gums, lozenges, and skin patches. ? Prescription mouth inhalers and nasal sprays.  Medicine that acts on the brain to reduce cravings and withdrawal symptoms.  A type of talk therapy that examines your triggers for tobacco use, how to avoid them, and how to cope with cravings (behavioral therapy).  Hypnosis. This may help with withdrawal symptoms.  Joining a support group for others coping with TUD. The best treatment for TUD is usually a combination of medicine, talk therapy, and support groups. Recovery can be a long process. Many people start using tobacco again after stopping (relapse). If you relapse, it does not mean that treatment will not work. Follow these instructions at home:  Lifestyle  Do not use any products that contain nicotine or tobacco, such as cigarettes and e-cigarettes.  Avoid things that trigger tobacco use as much as you can. Triggers include people and situations that usually cause you   to use tobacco.  Avoid drinks that contain caffeine, including coffee. These may worsen some withdrawal symptoms.  Find ways to manage stress. Wanting to smoke may cause stress, and stress can make you want to smoke. Relaxation techniques such as  deep breathing, meditation, and yoga may help.  Attend support groups as needed. These groups are an important part of long-term recovery for many people. General instructions  Take over-the-counter and prescription medicines only as told by your health care provider.  Check with your health care provider before taking any new prescription or over-the-counter medicines.  Decide on a friend, family member, or smoking quit-line (such as 1-800-QUIT-NOW in the [place] that you can call or text when you feel the urge to smoke or when you need help coping with cravings.  Keep all follow-up visits as told by your health care provider and therapist. This is important. Contact a health care provider if:  You are not able to take your medicines as prescribed.  Your symptoms get worse, even with treatment. Summary  Tobacco use disorder (TUD) occurs when a person craves, seeks, and uses tobacco regardless of the consequences.  This condition may be diagnosed based on your current and past tobacco use and a physical exam.  Many people are unable to quit on their own and need help. Recovery can be a long process.  The most effective treatment for TUD is usually a combination of medicine, talk therapy, and support groups. This information is not intended to replace advice given to you by your health care provider. Make sure you discuss any questions you have with your health care provider. Document Released: 07/16/2004 Document Revised: 10/28/2017 Document Reviewed: 10/28/2017 Elsevier Interactive Patient Education  2019 [place].    Low-Sodium Eating Plan Sodium, which is an element that makes up salt, helps you maintain a healthy balance of fluids in your body. Too much sodium can increase your blood pressure and cause fluid and waste to be held in your body. Your health care provider or dietitian may recommend following this plan if you have high blood pressure (hypertension), kidney  disease, liver disease, or heart failure. Eating less sodium can help lower your blood pressure, reduce swelling, and protect your heart, liver, and kidneys. What are tips for following this plan? General guidelines  Most people on this plan should limit their sodium intake to 1,500-2,000 mg (milligrams) of sodium each day. Reading food labels   The Nutrition Facts label lists the amount of sodium in one serving of the food. If you eat more than one serving, you must multiply the listed amount of sodium by the number of servings.  Choose foods with less than 140 mg of sodium per serving.  Avoid foods with 300 mg of sodium or more per serving. Shopping  Look for lower-sodium products, often labeled as "low-sodium" or "no salt added."  Always check the sodium content even if foods are labeled as "unsalted" or "no salt added".  Buy fresh foods. ? Avoid canned foods and premade or frozen meals. ? Avoid canned, cured, or processed meats  Buy breads that have less than 80 mg of sodium per slice. Cooking  Eat more home-cooked food and less restaurant, buffet, and fast food.  Avoid adding salt when cooking. Use salt-free seasonings or herbs instead of table salt or sea salt. Check with your health care provider or pharmacist before using salt substitutes.  Cook with plant-based oils, such as canola, sunflower, or olive oil. Meal planning  When eating at a restaurant, ask that your food be prepared with less salt or no salt, if possible.  Avoid foods that contain MSG (monosodium glutamate). MSG is sometimes added to [place] food, bouillon, and some canned foods. What foods are recommended? The items listed may not be a complete list. Talk with your dietitian about what dietary choices are best for you. Grains Low-sodium cereals, including oats, puffed wheat and rice, and shredded wheat. Low-sodium crackers. Unsalted rice. Unsalted pasta. Low-sodium bread. Whole-grain breads and  whole-grain pasta. Vegetables Fresh or frozen vegetables. "No salt added" canned vegetables. "No salt added" tomato sauce and paste. Low-sodium or reduced-sodium tomato and vegetable juice. Fruits Fresh, frozen, or canned fruit. Fruit juice. Meats and other protein foods Fresh or frozen (no salt added) meat, poultry, seafood, and fish. Low-sodium canned tuna and salmon. Unsalted nuts. Dried peas, beans, and lentils without added salt. Unsalted canned beans. Eggs. Unsalted nut butters. Dairy Milk. Soy milk. Cheese that is naturally low in sodium, such as ricotta cheese, fresh mozzarella, or Swiss cheese Low-sodium or reduced-sodium cheese. Cream cheese. Yogurt. Fats and oils Unsalted butter. Unsalted margarine with no trans fat. Vegetable oils such as canola or olive oils. Seasonings and other foods Fresh and dried herbs and spices. Salt-free seasonings. Low-sodium mustard and ketchup. Sodium-free salad dressing. Sodium-free light mayonnaise. Fresh or refrigerated horseradish. Lemon juice. Vinegar. Homemade, reduced-sodium, or low-sodium soups. Unsalted popcorn and pretzels. Low-salt or salt-free chips. What foods are not recommended? The items listed may not be a complete list. Talk with your dietitian about what dietary choices are best for you. Grains Instant hot cereals. Bread stuffing, pancake, and biscuit mixes. Croutons. Seasoned rice or pasta mixes. Noodle soup cups. Boxed or frozen macaroni and cheese. Regular salted crackers. Self-rising flour. Vegetables Sauerkraut, pickled vegetables, and relishes. Olives. [place] fries. Onion rings. Regular canned vegetables (not low-sodium or reduced-sodium). Regular canned tomato sauce and paste (not low-sodium or reduced-sodium). Regular tomato and vegetable juice (not low-sodium or reduced-sodium). Frozen vegetables in sauces. Meats and other protein foods Meat or fish that is salted, canned, smoked, spiced, or pickled. Bacon, ham, sausage,  hotdogs, corned beef, chipped beef, packaged lunch meats, salt pork, jerky, pickled herring, anchovies, regular canned tuna, sardines, salted nuts. Dairy Processed cheese and cheese spreads. Cheese curds. Blue cheese. Feta cheese. String cheese. Regular cottage cheese. Buttermilk. Canned milk. Fats and oils Salted butter. Regular margarine. Ghee. Bacon fat. Seasonings and other foods Onion salt, garlic salt, seasoned salt, table salt, and sea salt. Canned and packaged gravies. Worcestershire sauce. Tartar sauce. Barbecue sauce. Teriyaki sauce. Soy sauce, including reduced-sodium. Steak sauce. Fish sauce. Oyster sauce. Cocktail sauce. Horseradish that you find on the shelf. Regular ketchup and mustard. Meat flavorings and tenderizers. Bouillon cubes. Hot sauce and Tabasco sauce. Premade or packaged marinades. Premade or packaged taco seasonings. Relishes. Regular salad dressings. Salsa. Potato and tortilla chips. Corn chips and puffs. Salted popcorn and pretzels. Canned or dried soups. Pizza. Frozen entrees and pot pies. Summary  Eating less sodium can help lower your blood pressure, reduce swelling, and protect your heart, liver, and kidneys.  Most people on this plan should limit their sodium intake to 1,500-2,000 mg (milligrams) of sodium each day.  Canned, boxed, and frozen foods are high in sodium. Restaurant foods, fast foods, and pizza are also very high in sodium. You also get sodium by adding salt to food.  Try to cook at home, eat more fresh fruits and vegetables, and eat less fast food, canned,  processed, or prepared foods. This information is not intended to replace advice given to you by your health care provider. Make sure you discuss any questions you have with your health care provider. Document Released: 05/02/2002 Document Revised: 11/03/2016 Document Reviewed: 11/03/2016 Elsevier Interactive Patient Education  2019 [employer].

## 2019-01-11 ENCOUNTER — Encounter: Payer: Self-pay | Admitting: Nurse Practitioner

## 2019-01-18 ENCOUNTER — Encounter: Payer: Self-pay | Admitting: Nurse Practitioner

## 2019-03-11 ENCOUNTER — Ambulatory Visit: Payer: 59 | Admitting: Nurse Practitioner

## 2020-01-16 ENCOUNTER — Other Ambulatory Visit: Payer: Self-pay

## 2020-01-16 DIAGNOSIS — I10 Essential (primary) hypertension: Secondary | ICD-10-CM

## 2020-01-16 MED ORDER — AMLODIPINE BESYLATE 5 MG PO TABS: 5.0000 mg | ORAL_TABLET | Freq: Every day | ORAL | 0 refills | Status: DC

## 2020-01-24 ENCOUNTER — Ambulatory Visit: Payer: Self-pay | Admitting: Nurse Practitioner

## 2020-02-12 ENCOUNTER — Other Ambulatory Visit: Payer: Self-pay | Admitting: Nurse Practitioner

## 2020-02-12 DIAGNOSIS — I10 Essential (primary) hypertension: Secondary | ICD-10-CM

## 2020-04-11 ENCOUNTER — Encounter: Payer: Self-pay | Admitting: Nurse Practitioner

## 2020-04-11 ENCOUNTER — Other Ambulatory Visit: Payer: Self-pay

## 2020-04-11 ENCOUNTER — Ambulatory Visit: Payer: 59 | Admitting: Nurse Practitioner

## 2020-04-11 VITALS — BP 118/80 | HR 66 | Temp 97.9°F | Ht 63.2 in | Wt 185.2 lb

## 2020-04-11 DIAGNOSIS — R21 Rash and other nonspecific skin eruption: Secondary | ICD-10-CM

## 2020-04-11 DIAGNOSIS — B353 Tinea pedis: Secondary | ICD-10-CM | POA: Diagnosis not present

## 2020-04-11 DIAGNOSIS — J4520 Mild intermittent asthma, uncomplicated: Secondary | ICD-10-CM | POA: Diagnosis not present

## 2020-04-11 DIAGNOSIS — I10 Essential (primary) hypertension: Secondary | ICD-10-CM

## 2020-04-11 MED ORDER — LAMISIL AT SPRAY 1 % EX SOLN: 2.0000 | Freq: Two times a day (BID) | CUTANEOUS | 1 refills | Status: DC

## 2020-04-11 MED ORDER — NORETHINDRONE 0.35 MG PO TABS: 1.0000 | ORAL_TABLET | Freq: Every day | ORAL | 11 refills | Status: AC

## 2020-04-11 MED ORDER — AMLODIPINE BESYLATE 5 MG PO TABS: 5.0000 mg | ORAL_TABLET | Freq: Every day | ORAL | 1 refills | Status: AC

## 2020-04-11 NOTE — Progress Notes (Signed)
Subjective:     Patient ID: Natalie Thompson , female    DOB: 08/09/1996 , 24 y.o.   MRN: [id number]   Chief Complaint  Patient presents with  . Hypertension  . Referral    patient would like to see a dermatologist about her feet    HPI  She has not been seen in the last year.   Wt Readings from Last 3 Encounters: 04/11/20 : 185 lb 3.2 oz (84 kg) 01/10/19 : 187 lb (84.8 kg) 12/27/18 : 188 lb 3.2 oz (85.4 kg)  She has black spots on her face about one week. She does not have a skin care routine at this time. She reports having peeling skin to her feet over one year. She has not used any agents to her feet.    Hypertension This is a chronic problem. The current episode started more than 1 month ago. The problem has been gradually improving since onset. The problem is controlled. Pertinent negatives include no anxiety, chest pain, headaches, palpitations or shortness of breath. There are no associated agents to hypertension. Risk factors for coronary artery disease include stress. There are no compliance problems.  There is no history of angina.  Asthma There is no chest tightness or shortness of breath. This is a chronic problem. The current episode started more than 1 year ago. The problem occurs intermittently. The problem has been unchanged. Pertinent negatives include no appetite change, chest pain, headaches or sore throat. Her past medical history is significant for asthma.     Past Medical History:  Diagnosis Date  . Allergy   . Asthma      Family History  Problem Relation Age of Onset  . Cancer Other   . Diabetes Other      Current Outpatient Medications:  .  albuterol (PROAIR HFA) 108 (90 Base) MCG/ACT inhaler, Inhale 2 puffs into the lungs every 6 (six) hours as needed for wheezing or shortness of breath., Disp: 8.5 g, Rfl: 6 .  amLODipine (NORVASC) 5 MG tablet, Take 1 tablet (5 mg total) by mouth daily., Disp: 90 tablet, Rfl: 1 .  norethindrone (NORLYDA) 0.35  MG tablet, Take 1 tablet (0.35 mg total) by mouth daily., Disp: 1 Package, Rfl: 11 .  Terbinafine HCl (LAMISIL AT SPRAY) 1 % SOLN, Apply 2 sprays topically in the morning and at bedtime., Disp: 125 mL, Rfl: 1   No Known Allergies   Review of Systems  Constitutional: Negative.  Negative for appetite change and fatigue.  HENT: Negative for congestion and sore throat.   Eyes: Negative for visual disturbance.  Respiratory: Negative.  Negative for shortness of breath.   Cardiovascular: Negative.  Negative for chest pain, palpitations and leg swelling.  Gastrointestinal: Negative.   Endocrine: Negative.   Musculoskeletal: Negative.   Skin: Negative.        Reports skin peeling to her feet. Also having darkened areas to face.   Neurological: Negative for dizziness, weakness and headaches.  Psychiatric/Behavioral: Negative for confusion. The patient is not nervous/anxious.       Today's Vitals   04/11/20 1627  BP: 118/80  Pulse: 66  Temp: 97.9 F (36.6 C)  TempSrc: Oral  Weight: 185 lb 3.2 oz (84 kg)  Height: 5' 3.2" (1.605 m)  PainSc: 0-No pain   Body mass index is 32.6 kg/m.   Objective:  Physical Exam Vitals reviewed.  Constitutional:      Appearance: She is well-developed.  HENT:     Head: Normocephalic and  atraumatic.  Eyes:     Pupils: Pupils are equal, round, and reactive to light.  Cardiovascular:     Rate and Rhythm: Normal rate and regular rhythm.     Pulses: Normal pulses.     Heart sounds: Normal heart sounds. No murmur.  Pulmonary:     Effort: Pulmonary effort is normal.     Breath sounds: Normal breath sounds.  Musculoskeletal:        General: Normal range of motion.  Skin:    General: Skin is warm and dry.     Capillary Refill: Capillary refill takes less than 2 seconds.     Findings: Rash (bilateral feet with peeling skin ) present.  Neurological:     General: No focal deficit present.     Mental Status: She is alert and oriented to person, place,  and time.     Cranial Nerves: No cranial nerve deficit.  Psychiatric:        Mood and Affect: Mood normal.        Behavior: Behavior normal.        Thought Content: Thought content normal.        Judgment: Judgment normal.         Assessment And Plan:     1. Hypertension, unspecified type  Blood pressure is well controlled  Encouraged to continue to limit intake of high salt foods and to drink at least 1/2 body weight in ounces of water daily  Continue to increase her physical activity  2. Mild intermittent asthma without complication  Reports wheezing with exercise and needs refill of proair  No current wheezing  3. Rash and nonspecific skin eruption  Peeling rash to feet and darkened areas to face  4. Tinea pedis of both feet  She is to use lamisil spray and she would like a referral to podiatry - Ambulatory referral to Podiatry - Terbinafine HCl (LAMISIL AT SPRAY) 1 % SOLN; Apply 2 sprays topically in the morning and at bedtime.  Dispense: 125 mL; Refill: Stark, FNP

## 2020-04-12 LAB — BMP8+EGFR
BUN/Creatinine Ratio: 18 (ref 9–23)
BUN: 13 mg/dL (ref 6–20)
CO2: 23 mmol/L (ref 20–29)
Calcium: 9.5 mg/dL (ref 8.7–10.2)
Chloride: 104 mmol/L (ref 96–106)
Creatinine, Ser: 0.73 mg/dL (ref 0.57–1.00)
GFR calc Af Amer: 132 mL/min/{1.73_m2} (ref >59)
GFR calc non Af Amer: 115 mL/min/{1.73_m2} (ref >59)
Glucose: 77 mg/dL (ref 65–99)
Potassium: 4.4 mmol/L (ref 3.5–5.2)
Sodium: 139 mmol/L (ref 134–144)

## 2020-06-05 ENCOUNTER — Encounter: Payer: Self-pay | Admitting: Sports Medicine

## 2020-06-05 ENCOUNTER — Ambulatory Visit: Payer: 59 | Admitting: Sports Medicine

## 2020-06-05 ENCOUNTER — Other Ambulatory Visit: Payer: Self-pay

## 2020-06-05 DIAGNOSIS — B359 Dermatophytosis, unspecified: Secondary | ICD-10-CM

## 2020-06-05 DIAGNOSIS — L853 Xerosis cutis: Secondary | ICD-10-CM

## 2020-06-05 MED ORDER — CLOTRIMAZOLE 1 % EX SOLN
1.0000 | Freq: Every day | CUTANEOUS | 5 refills | Status: DC
Start: 2020-06-05 — End: 2020-06-28

## 2020-06-05 MED ORDER — NYSTATIN-TRIAMCINOLONE 100000-0.1 UNIT/GM-% EX OINT
1.0000 | TOPICAL_OINTMENT | Freq: Two times a day (BID) | CUTANEOUS | 0 refills | Status: DC
Start: 2020-06-05 — End: 2020-06-28

## 2020-06-05 NOTE — Progress Notes (Signed)
Subjective: Natalie Thompson is a 24 y.o. female patient who presents to office for evaluation of dry peeling skin to both feet.  Patient reports that she has dealt with this about 2 years has tried Lamisil over-the-counter occasionally which gives some relief but is concerned that it is getting worse.  Patient denies any other pedal complaints at this time.  Review of symptoms noncontributory  Patient Active Problem List   Diagnosis Date Noted  . Mild intermittent asthma without complication 69/48/5462  . Tobacco abuse 01/10/2019  . Class 1 obesity due to excess calories with serious comorbidity and body mass index (BMI) of 32.0 to 32.9 in adult 01/10/2019  . Hypertension 12/27/2018    Current Outpatient Medications on File Prior to Visit  Medication Sig Dispense Refill  . albuterol (PROAIR HFA) 108 (90 Base) MCG/ACT inhaler Inhale 2 puffs into the lungs every 6 (six) hours as needed for wheezing or shortness of breath. 8.5 g 6  . amLODipine (NORVASC) 5 MG tablet Take 1 tablet (5 mg total) by mouth daily. 90 tablet 1  . azithromycin (ZITHROMAX) 250 MG tablet See admin instructions.    . Cholecalciferol (VITAMIN D3) 1.25 MG (50000 UT) CAPS Take 1 capsule by mouth once a week.    . fluconazole (DIFLUCAN) 150 MG tablet Take 150 mg by mouth every 3 (three) days.    . norethindrone (NORLYDA) 0.35 MG tablet Take 1 tablet (0.35 mg total) by mouth daily. 1 Package 11  . Terbinafine HCl (LAMISIL AT SPRAY) 1 % SOLN Apply 2 sprays topically in the morning and at bedtime. 125 mL 1  . tinidazole (TINDAMAX) 500 MG tablet Take 2,000 mg by mouth daily.     No current facility-administered medications on file prior to visit.    Allergies  Allergen Reactions  . No Known Allergies     Objective:  General: Alert and oriented x3 in no acute distress  Dermatology: Dry scaly skin plantar surfaces bilateral, no webspace maceration, all nails x 10 are well manicured.  Vascular: Dorsalis Pedis and  Posterior Tibial pedal pulses 2/4, Capillary Fill Time 3 seconds, + pedal hair growth bilateral, no edema bilateral lower extremities, Temperature gradient within normal limits.  Neurology: Gross sensation intact via light touch bilateral.  Musculoskeletal: Pes planus foot type.  No pain to palpation bilateral. Assessment and Plan: Problem List Items Addressed This Visit    None    Visit Diagnoses    Tinea    -  Primary   Relevant Medications   azithromycin (ZITHROMAX) 250 MG tablet   fluconazole (DIFLUCAN) 150 MG tablet   tinidazole (TINDAMAX) 500 MG tablet   clotrimazole (LOTRIMIN) 1 % external solution   nystatin-triamcinolone ointment (MYCOLOG)   Dry skin          -Complete examination performed -Discussed treatment options for tinea and dry skin -Prescribed nystatin ointment to use as instructed as well as clotrimazole solution in between the toes -Encouraged daily skin emollients provided patient with a sample of foot miracle cream -Encouraged use of pumice stone -Advised good supportive shoes and inserts for foot type -Patient to return to office 6 to 8 weeks for medication check or sooner if condition worsens.  Landis Martins, DPM

## 2020-06-28 ENCOUNTER — Ambulatory Visit
Admission: EM | Admit: 2020-06-28 | Discharge: 2020-06-28 | Disposition: A | Discharge status: Home / Self Care | Payer: 59 | Attending: Emergency Medicine | Admitting: Emergency Medicine

## 2020-06-28 ENCOUNTER — Other Ambulatory Visit: Payer: Self-pay

## 2020-06-28 ENCOUNTER — Encounter: Payer: Self-pay | Admitting: Emergency Medicine

## 2020-06-28 DIAGNOSIS — M545 Low back pain, unspecified: Secondary | ICD-10-CM

## 2020-06-28 HISTORY — DX: Essential (primary) hypertension: I10

## 2020-06-28 MED ORDER — NAPROXEN 500 MG PO TABS: 500.0000 mg | ORAL_TABLET | Freq: Two times a day (BID) | ORAL | 0 refills | Status: AC

## 2020-06-28 MED ORDER — CYCLOBENZAPRINE HCL 5 MG PO TABS
5.0000 mg | ORAL_TABLET | Freq: Every evening | ORAL | 0 refills | Status: DC | PRN
Start: 2020-06-28 — End: 2021-01-08

## 2020-06-28 NOTE — ED Provider Notes (Signed)
[name]    CSN: [id number] Arrival date & time: 06/28/20  1302      History   Chief Complaint Chief Complaint  Patient presents with  . Back Pain    HPI Natalie Thompson is a 24 y.o. female presents for left low back pain.  States pain is nonradiating, achy.  Has tried nothing for relief.  Denies trauma/injury to the affected area and does not recall an inciting event.  Denies fever, saddle area anesthesia, lower extremity numbness/weakness, urinary retention, fecal incontinence.      Past Medical History:  Diagnosis Date  . Allergy   . Asthma   . Hypertension     Patient Active Problem List   Diagnosis Date Noted  . Mild intermittent asthma without complication 25/95/6387  . Tobacco abuse 01/10/2019  . Class 1 obesity due to excess calories with serious comorbidity and body mass index (BMI) of 32.0 to 32.9 in adult 01/10/2019  . Hypertension 12/27/2018    History reviewed. No pertinent surgical history.  OB History   No obstetric history on file.      Home Medications    Prior to Admission medications   Medication Sig Start Date End Date Taking? Authorizing Provider  albuterol (PROAIR HFA) 108 (90 Base) MCG/ACT inhaler Inhale 2 puffs into the lungs every 6 (six) hours as needed for wheezing or shortness of breath. 01/10/19   Minette Brine, FNP  amLODipine (NORVASC) 5 MG tablet Take 1 tablet (5 mg total) by mouth daily. 04/11/20 04/11/21  Minette Brine, FNP  Cholecalciferol (VITAMIN D3) 1.25 MG (50000 UT) CAPS Take 1 capsule by mouth once a week. 05/21/20   [provider]  cyclobenzaprine (FLEXERIL) 5 MG tablet Take 1 tablet (5 mg total) by mouth at bedtime as needed for muscle spasms. 06/28/20   Hall-Potvin, [place], PA-C  naproxen (NAPROSYN) 500 MG tablet Take 1 tablet (500 mg total) by mouth 2 (two) times daily. 06/28/20   Hall-Potvin, [place], PA-C  norethindrone (NORLYDA) 0.35 MG tablet Take 1 tablet (0.35 mg total) by mouth daily.  04/11/20   Minette Brine, FNP    Family History Family History  Problem Relation Age of Onset  . Cancer Other   . Diabetes Other     Social History Social History   Tobacco Use  . Smoking status: Never Smoker  . Smokeless tobacco: Never Used  Substance Use Topics  . Alcohol use: Yes    Alcohol/week: 0.0 standard drinks    Comment: occ  . Drug use: Yes    Types: Marijuana     Allergies   No known allergies   Review of Systems As per HPI   Physical Exam Triage Vital Signs ED Triage Vitals  Enc Vitals Group     BP 06/28/20 1322 128/84     Pulse Rate 06/28/20 1322 79     Resp 06/28/20 1322 16     Temp 06/28/20 1322 98.2 F (36.8 C)     Temp Source 06/28/20 1322 Oral     SpO2 06/28/20 1322 98 %     Weight --      Height --      Head Circumference --      Peak Flow --      Pain Score 06/28/20 1325 7     Pain Loc --      Pain Edu? --      Excl. in [place]? --    No data found.  Updated Vital Signs BP 128/84 (BP  Location: Left Arm)   Pulse 79   Temp 98.2 F (36.8 C) (Oral)   Resp 16   SpO2 98%   Visual Acuity Right Eye Distance:   Left Eye Distance:   Bilateral Distance:    Right Eye Near:   Left Eye Near:    Bilateral Near:     Physical Exam Constitutional:      General: She is not in acute distress. HENT:     Head: Normocephalic and atraumatic.  Eyes:     General: No scleral icterus.    Pupils: Pupils are equal, round, and reactive to light.  Cardiovascular:     Rate and Rhythm: Normal rate.  Pulmonary:     Effort: Pulmonary effort is normal.  Musculoskeletal:        General: Tenderness present. No swelling or deformity.     Comments: Left lumbar tenderness that is diffuse and spares spinous process, PSIS.  No crepitus, mass.  Skin:    Coloration: Skin is not jaundiced or pale.     Findings: No bruising or rash.  Neurological:     General: No focal deficit present.     Mental Status: She is alert and oriented to person, place, and time.        UC Treatments / Results  Labs (all labs ordered are listed, but only abnormal results are displayed) Labs Reviewed - No data to display  EKG   Radiology No results found.  Procedures Procedures (including critical care time)  Medications Ordered in UC Medications - No data to display  Initial Impression / Assessment and Plan / UC Course  I have reviewed the triage vital signs and the nursing notes.  Pertinent labs & imaging results that were available during my care of the patient were reviewed by me and considered in my medical decision making (see chart for details).     Patient without red flag signs/symptoms as mentioned in HPI, PE.  Exam reassuring at this time.  Likely musculoskeletal/strain given line of work.  Will treat supportively as outlined below.  Return precautions discussed, pt verbalized understanding and is agreeable to plan. Final Clinical Impressions(s) / UC Diagnoses   Final diagnoses:  Acute left-sided low back pain without sciatica     Discharge Instructions     RICE: rest, ice, compression, elevation as needed for pain.    Heat therapy (hot compress, warm wash rag, hot showers, etc.) can help relax muscles and soothe muscle aches. Cold therapy (ice packs) can be used to help swelling both after injury and after prolonged use of areas of chronic pain/aches.  Pain medication:  500 mg Naprosyn/Aleve (naproxen) every 12 hours with food:  AVOID other NSAIDs while taking this (may have Tylenol).  May take muscle relaxer as needed for severe pain / spasm.  (This medication may cause you to become tired so it is important you do not drink alcohol or operate heavy machinery while on this medication.  Recommend your first dose to be taken before bedtime to monitor for side effects safely)  Important to follow up with specialist(s) below for further evaluation/management if your symptoms persist or worsen.    ED Prescriptions    Medication Sig  Dispense Auth. Provider   naproxen (NAPROSYN) 500 MG tablet Take 1 tablet (500 mg total) by mouth 2 (two) times daily. 30 tablet Hall-Potvin, [place], PA-C   cyclobenzaprine (FLEXERIL) 5 MG tablet Take 1 tablet (5 mg total) by mouth at bedtime as needed for muscle spasms.  15 tablet Hall-Potvin, [place], PA-C     PDMP not reviewed this encounter.   Hall-Potvin, [place], [place] 06/28/20 1346

## 2020-06-28 NOTE — ED Triage Notes (Signed)
Patient presents to [facility] for assessment of left lower back pain starting Sunday of this week.  Patient states occasional right groin pain as well.  Patient c/o worsening pain with movement, states 1/10 pain when sitting still.

## 2020-06-28 NOTE — Discharge Instructions (Signed)

## 2020-07-05 ENCOUNTER — Ambulatory Visit: Payer: 59 | Admitting: Sports Medicine

## 2020-10-02 ENCOUNTER — Other Ambulatory Visit: Payer: Self-pay | Admitting: Obstetrics and Gynecology

## 2020-10-02 DIAGNOSIS — N63 Unspecified lump in unspecified breast: Secondary | ICD-10-CM

## 2020-10-15 ENCOUNTER — Ambulatory Visit: Payer: 59 | Admitting: Nurse Practitioner

## 2021-01-08 ENCOUNTER — Ambulatory Visit: Payer: Self-pay | Admitting: Physician Assistant

## 2021-01-08 ENCOUNTER — Other Ambulatory Visit: Payer: Self-pay

## 2021-01-08 DIAGNOSIS — M25511 Pain in right shoulder: Secondary | ICD-10-CM

## 2021-01-08 MED ORDER — IBUPROFEN 800 MG PO TABS: 800.0000 mg | ORAL_TABLET | Freq: Three times a day (TID) | ORAL | 0 refills | Status: DC | PRN

## 2021-01-08 NOTE — Progress Notes (Signed)
Patient verified DOB Patient complains of a right shoulder strain in the rotator cuff since working out a couple weeks ago. Patient reported stretching increased the pain to radiating into her chest. Patient denies pain when at rest or laying down. Patient shares pain presents and increases with using the muscle. Patient describes pain as tight and pulling.

## 2021-01-08 NOTE — Patient Instructions (Signed)
For your shoulder and chest pain, I encourage you to use 10 mg of cyclobenzaprine as directed, 800 mg of ibuprofen every 6-8 hours as needed, ice, rest.  Make sure you are staying very well-hydrated, and I encourage you to also take vitamin D over-the-counter  I hope that you feel better soon, please let me know if we can do for you  Kennieth Rad, PA-C Physician Assistant [facility] [email or web address]    Shoulder Pain Many things can cause shoulder pain, including:  An injury to the shoulder.  Overuse of the shoulder.  Arthritis. The source of the pain can be:  Inflammation.  An injury to the shoulder joint.  An injury to a tendon, ligament, or bone. Follow these instructions at home: Pay attention to changes in your symptoms. Let your health care provider know about them. Follow these instructions to relieve your pain. If you have a sling:  Wear the sling as told by your health care provider. Remove it only as told by your health care provider.  Loosen the sling if your fingers tingle, become numb, or turn cold and blue.  Keep the sling clean.  If the sling is not waterproof: ? Do not let it get wet. Remove it to shower or bathe.  Move your arm as little as possible, but keep your hand moving to prevent swelling. Managing pain, stiffness, and swelling  If directed, put ice on the painful area: ? Put ice in a plastic bag. ? Place a towel between your skin and the bag. ? Leave the ice on for 20 minutes, 2-3 times per day. Stop applying ice if it does not help with the pain.  Squeeze a soft ball or a foam pad as much as possible. This helps to keep the shoulder from swelling. It also helps to strengthen the arm.   General instructions  Take over-the-counter and prescription medicines only as told by your health care provider.  Keep all follow-up visits as told by your health care provider. This is  important. Contact a health care provider if:  Your pain gets worse.  Your pain is not relieved with medicines.  New pain develops in your arm, hand, or fingers. Get help right away if:  Your arm, hand, or fingers: ? Tingle. ? Become numb. ? Become swollen. ? Become painful. ? Turn white or blue. Summary  Shoulder pain can be caused by an injury, overuse, or arthritis.  Pay attention to changes in your symptoms. Let your health care provider know about them.  This condition may be treated with a sling, ice, and pain medicines.  Contact your health care provider if the pain gets worse or new pain develops. Get help right away if your arm, hand, or fingers tingle or become numb, swollen, or painful.  Keep all follow-up visits as told by your health care provider. This is important. This information is not intended to replace advice given to you by your health care provider. Make sure you discuss any questions you have with your health care provider. Document Revised: 05/25/2018 Document Reviewed: 05/25/2018 Elsevier Patient Education  2021 [employer].

## 2021-01-08 NOTE — Progress Notes (Signed)
New Patient Office Visit  Subjective:  Patient ID: Natalie Thompson, female    DOB: 1996-05-03  Age: 25 y.o. MRN: [id number]  CC:  Chief Complaint  Patient presents with  . Shoulder Pain   Virtual Visit via Telephone Note  I connected with Natalie Thompson on 01/08/21 at  1:20 PM EST by telephone and verified that I am speaking with the correct person using two identifiers.  Location: Patient: Home Provider: [facility]    I discussed the limitations, risks, security and privacy concerns of performing an evaluation and management service by telephone and the availability of in person appointments. I also discussed with the patient that there may be a patient responsible charge related to this service. The patient expressed understanding and agreed to proceed.   History of Present Illness:   Natalie Thompson reports that she has been having shoulder and chest pain.  Reports that her shoulder pain started Saturday soon after exercising.  Reports that she was running, denies any weight lifting.  Reports that she cannot attribute any type of injury or trauma.  States that her shoulder pain has started radiating into her chest, states that the pain is only present with movement.  Reports that she has limited range of movement of her right arm.  Reports that she is able to get rest, however the pain does wake her up.  Reports that she was previously prescribed 5 mg of Flexeril, did try that last night without much relief.  Otherwise has not tried anything for relief    Observations/Objective: Medical history and current medications reviewed, no physical exam completed Yes   Past Medical History:  Diagnosis Date  . Allergy   . Asthma   . Hypertension     History reviewed. No pertinent surgical history.  Family History  Problem Relation Age of Onset  . Cancer Other   . Diabetes Other     Social History   Socioeconomic History  . Marital  status: Single    Spouse name: Not on file  . Number of children: Not on file  . Years of education: Not on file  . Highest education level: Not on file  Occupational History  . Not on file  Tobacco Use  . Smoking status: Never Smoker  . Smokeless tobacco: Never Used  Substance and Sexual Activity  . Alcohol use: Yes    Alcohol/week: 0.0 standard drinks    Comment: occ  . Drug use: Yes    Types: Marijuana  . Sexual activity: Yes    Birth control/protection: Condom, Pill  Other Topics Concern  . Not on file  Social History Narrative  . Not on file   Social Determinants of Health   Financial Resource Strain: Not on file  Food Insecurity: Not on file  Transportation Needs: Not on file  Physical Activity: Not on file  Stress: Not on file  Social Connections: Not on file  Intimate Partner Violence: Not on file    ROS Review of Systems  Constitutional: Negative for chills, fatigue and fever.  HENT: Negative for congestion.   Respiratory: Negative for cough and shortness of breath.   Cardiovascular: Negative for chest pain and palpitations.  Gastrointestinal: Negative for abdominal pain.  Endocrine: Negative.   Genitourinary: Negative.   Musculoskeletal: Positive for arthralgias and myalgias. Negative for joint swelling.  Skin: Negative for color change and wound.  Allergic/Immunologic: Negative.   Neurological: Negative for headaches.  Hematological: Negative.   Psychiatric/Behavioral: Negative.  Objective:   Today's Vitals: There were no vitals taken for this visit.    Assessment & Plan:   Problem List Items Addressed This Visit   None   Visit Diagnoses    Acute pain of right shoulder    -  Primary   Relevant Medications   ibuprofen (ADVIL) 800 MG tablet      Outpatient Encounter Medications as of 01/08/2021  Medication Sig  . ibuprofen (ADVIL) 800 MG tablet Take 1 tablet (800 mg total) by mouth every 8 (eight) hours as needed.  Marland Kitchen albuterol (PROAIR  HFA) 108 (90 Base) MCG/ACT inhaler Inhale 2 puffs into the lungs every 6 (six) hours as needed for wheezing or shortness of breath.  Marland Kitchen amLODipine (NORVASC) 5 MG tablet Take 1 tablet (5 mg total) by mouth daily.  . Cholecalciferol (VITAMIN D3) 1.25 MG (50000 UT) CAPS Take 1 capsule by mouth once a week.  . naproxen (NAPROSYN) 500 MG tablet Take 1 tablet (500 mg total) by mouth 2 (two) times daily.  . norethindrone (NORLYDA) 0.35 MG tablet Take 1 tablet (0.35 mg total) by mouth daily.  . [DISCONTINUED] cyclobenzaprine (FLEXERIL) 5 MG tablet Take 1 tablet (5 mg total) by mouth at bedtime as needed for muscle spasms.   No facility-administered encounter medications on file as of 01/08/2021.   Assessment and Plan:    1. Acute pain of right shoulder Patient is uninsured and wants to hold off on x-ray at this time due to financial constraints.  Patient education given on rest, ice, trial ibuprofen.  Patient wants to use current supply of Flexeril, encouraged patient to increase dose to 10 mg. - ibuprofen (ADVIL) 800 MG tablet; Take 1 tablet (800 mg total) by mouth every 8 (eight) hours as needed.  Dispense: 30 tablet; Refill: 0    Follow Up Instructions:    I discussed the assessment and treatment plan with the patient. The patient was provided an opportunity to ask questions and all were answered. The patient agreed with the plan and demonstrated an understanding of the instructions.   The patient was advised to call back or seek an in-person evaluation if the symptoms worsen or if the condition fails to improve as anticipated.  I provided 21 minutes of non-face-to-face time during this encounter.    Follow-up: Return if symptoms worsen or fail to improve.   Loraine Grip Zanden Colver, PA-C

## 2021-03-05 ENCOUNTER — Other Ambulatory Visit: Payer: Self-pay

## 2021-03-05 ENCOUNTER — Ambulatory Visit (INDEPENDENT_AMBULATORY_CARE_PROVIDER_SITE_OTHER): Payer: Self-pay

## 2021-03-05 ENCOUNTER — Ambulatory Visit
Admission: EM | Admit: 2021-03-05 | Discharge: 2021-03-05 | Disposition: A | Discharge status: Home / Self Care | Payer: Medicaid Other | Attending: Emergency Medicine | Admitting: Emergency Medicine

## 2021-03-05 DIAGNOSIS — R2241 Localized swelling, mass and lump, right lower limb: Secondary | ICD-10-CM

## 2021-03-05 DIAGNOSIS — M79632 Pain in left forearm: Secondary | ICD-10-CM

## 2021-03-05 DIAGNOSIS — S5012XA Contusion of left forearm, initial encounter: Secondary | ICD-10-CM

## 2021-03-05 DIAGNOSIS — R1030 Lower abdominal pain, unspecified: Secondary | ICD-10-CM

## 2021-03-05 DIAGNOSIS — R0789 Other chest pain: Secondary | ICD-10-CM

## 2021-03-05 DIAGNOSIS — M79671 Pain in right foot: Secondary | ICD-10-CM

## 2021-03-05 DIAGNOSIS — S93601A Unspecified sprain of right foot, initial encounter: Secondary | ICD-10-CM

## 2021-03-05 MED ORDER — IBUPROFEN 800 MG PO TABS: 800.0000 mg | ORAL_TABLET | Freq: Three times a day (TID) | ORAL | 0 refills | Status: AC

## 2021-03-05 MED ORDER — CYCLOBENZAPRINE HCL 5 MG PO TABS: 5.0000 mg | ORAL_TABLET | Freq: Two times a day (BID) | ORAL | 0 refills | Status: AC | PRN

## 2021-03-05 NOTE — Discharge Instructions (Signed)
Use anti-inflammatories for pain/swelling. You may take up to 800 mg Ibuprofen every 8 hours with food. You may supplement Ibuprofen with Tylenol [phone] mg every 8 hours.  You may use flexeril as needed to help with pain. This is a muscle relaxer and causes sedation- please use only at bedtime or when you will be home and not have to drive/work Alternate ice and heat Gentle stretching Follow-up if not improving or worsening
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
## 2021-08-05 LAB — HM PAP SMEAR

## 2021-08-07 ENCOUNTER — Other Ambulatory Visit: Payer: Self-pay | Admitting: Obstetrics and Gynecology

## 2021-08-07 DIAGNOSIS — D241 Benign neoplasm of right breast: Secondary | ICD-10-CM

## 2021-08-12 ENCOUNTER — Other Ambulatory Visit: Payer: BC Managed Care – PPO

## 2021-08-15 ENCOUNTER — Encounter: Payer: BC Managed Care – PPO | Admitting: Nurse Practitioner

## 2021-08-28 ENCOUNTER — Other Ambulatory Visit: Payer: Self-pay

## 2021-08-28 ENCOUNTER — Ambulatory Visit
Admission: RE | Admit: 2021-08-28 | Discharge: 2021-08-28 | Disposition: A | Discharge status: Home / Self Care | Payer: BC Managed Care – PPO | Source: Ambulatory Visit | Attending: Obstetrics and Gynecology | Admitting: Obstetrics and Gynecology

## 2021-08-28 DIAGNOSIS — D241 Benign neoplasm of right breast: Secondary | ICD-10-CM

## 2021-09-19 ENCOUNTER — Ambulatory Visit: Payer: BC Managed Care – PPO | Admitting: Nurse Practitioner
